# Patient Record
Sex: Female | Born: 1960 | Race: White | Hispanic: No | Marital: Married | Smoking: Former smoker
Health system: Southern US, Community
[De-identification: ages and names within clinical notes are randomized; demographics above are authoritative.]

## PROBLEM LIST (undated history)

## (undated) DIAGNOSIS — E079 Disorder of thyroid, unspecified: Secondary | ICD-10-CM

## (undated) DIAGNOSIS — T783XXA Angioneurotic edema, initial encounter: Secondary | ICD-10-CM

## (undated) DIAGNOSIS — L509 Urticaria, unspecified: Secondary | ICD-10-CM

## (undated) HISTORY — DX: Urticaria, unspecified: L50.9

## (undated) HISTORY — PX: COLONOSCOPY: SHX174

## (undated) HISTORY — DX: Angioneurotic edema, initial encounter: T78.3XXA

## (undated) HISTORY — PX: TONSILLECTOMY: SUR1361

---

## 2018-03-07 ENCOUNTER — Other Ambulatory Visit: Payer: Self-pay

## 2018-03-07 ENCOUNTER — Emergency Department (HOSPITAL_BASED_OUTPATIENT_CLINIC_OR_DEPARTMENT_OTHER)
Admission: EM | Admit: 2018-03-07 | Discharge: 2018-03-07 | Disposition: A | Discharge status: Home / Self Care | Payer: No Typology Code available for payment source | Attending: Emergency Medicine | Admitting: Emergency Medicine

## 2018-03-07 ENCOUNTER — Emergency Department (HOSPITAL_BASED_OUTPATIENT_CLINIC_OR_DEPARTMENT_OTHER): Payer: No Typology Code available for payment source

## 2018-03-07 ENCOUNTER — Encounter (HOSPITAL_BASED_OUTPATIENT_CLINIC_OR_DEPARTMENT_OTHER): Payer: Self-pay | Admitting: *Deleted

## 2018-03-07 DIAGNOSIS — Z79899 Other long term (current) drug therapy: Secondary | ICD-10-CM | POA: Insufficient documentation

## 2018-03-07 DIAGNOSIS — R1032 Left lower quadrant pain: Secondary | ICD-10-CM | POA: Diagnosis present

## 2018-03-07 DIAGNOSIS — K5732 Diverticulitis of large intestine without perforation or abscess without bleeding: Secondary | ICD-10-CM | POA: Insufficient documentation

## 2018-03-07 DIAGNOSIS — Z87891 Personal history of nicotine dependence: Secondary | ICD-10-CM | POA: Diagnosis not present

## 2018-03-07 DIAGNOSIS — K5792 Diverticulitis of intestine, part unspecified, without perforation or abscess without bleeding: Secondary | ICD-10-CM

## 2018-03-07 HISTORY — DX: Disorder of thyroid, unspecified: E07.9

## 2018-03-07 LAB — COMPREHENSIVE METABOLIC PANEL
ALT: 13 U/L (ref 0–44)
AST: 14 U/L — ABNORMAL LOW (ref 15–41)
Albumin: 3.7 g/dL (ref 3.5–5.0)
Alkaline Phosphatase: 74 U/L (ref 38–126)
Anion gap: 6 (ref 5–15)
BUN: 15 mg/dL (ref 6–20)
CO2: 26 mmol/L (ref 22–32)
Calcium: 8.8 mg/dL — ABNORMAL LOW (ref 8.9–10.3)
Chloride: 104 mmol/L (ref 98–111)
Creatinine, Ser: 0.76 mg/dL (ref 0.44–1.00)
GFR calc Af Amer: >60 mL/min (ref >60)
GFR calc non Af Amer: >60 mL/min (ref >60)
Glucose, Bld: 94 mg/dL (ref 70–99)
Potassium: 3.7 mmol/L (ref 3.5–5.1)
Sodium: 136 mmol/L (ref 135–145)
Total Bilirubin: 0.4 mg/dL (ref 0.3–1.2)
Total Protein: 7.1 g/dL (ref 6.5–8.1)

## 2018-03-07 LAB — CBC WITH DIFFERENTIAL/PLATELET
Abs Immature Granulocytes: 0.03 10*3/uL (ref 0.00–0.07)
Basophils Absolute: 0 10*3/uL (ref 0.0–0.1)
Basophils Relative: 0 %
Eosinophils Absolute: 0.1 10*3/uL (ref 0.0–0.5)
Eosinophils Relative: 1 %
HCT: 42.5 % (ref 36.0–46.0)
Hemoglobin: 13.5 g/dL (ref 12.0–15.0)
Immature Granulocytes: 0 %
Lymphocytes Relative: 26 %
Lymphs Abs: 2.6 10*3/uL (ref 0.7–4.0)
MCH: 29.5 pg (ref 26.0–34.0)
MCHC: 31.8 g/dL (ref 30.0–36.0)
MCV: 92.8 fL (ref 80.0–100.0)
Monocytes Absolute: 0.7 10*3/uL (ref 0.1–1.0)
Monocytes Relative: 7 %
Neutro Abs: 6.5 10*3/uL (ref 1.7–7.7)
Neutrophils Relative %: 66 %
Platelets: 380 10*3/uL (ref 150–400)
RBC: 4.58 MIL/uL (ref 3.87–5.11)
RDW: 12.9 % (ref 11.5–15.5)
WBC: 9.9 10*3/uL (ref 4.0–10.5)
nRBC: 0 % (ref 0.0–0.2)

## 2018-03-07 LAB — URINALYSIS, MICROSCOPIC (REFLEX)

## 2018-03-07 LAB — URINALYSIS, ROUTINE W REFLEX MICROSCOPIC
Bilirubin Urine: NEGATIVE
Glucose, UA: NEGATIVE mg/dL
Ketones, ur: NEGATIVE mg/dL
Nitrite: NEGATIVE
Protein, ur: NEGATIVE mg/dL
Specific Gravity, Urine: >1.03 — ABNORMAL HIGH (ref 1.005–1.030)
pH: 5 (ref 5.0–8.0)

## 2018-03-07 LAB — LIPASE, BLOOD: Lipase: 28 U/L (ref 11–51)

## 2018-03-07 MED ORDER — IOPAMIDOL (ISOVUE-300) INJECTION 61%
100.0000 mL | Freq: Once | INTRAVENOUS | Status: AC | PRN
  Administered 2018-03-07: 100 mL via INTRAVENOUS

## 2018-03-07 MED ORDER — SODIUM CHLORIDE 0.9 % IV BOLUS
500.0000 mL | Freq: Once | INTRAVENOUS | Status: AC
  Administered 2018-03-07: 500 mL via INTRAVENOUS

## 2018-03-07 MED ORDER — HYDROCODONE-ACETAMINOPHEN 5-325 MG PO TABS: 1.0000 | ORAL_TABLET | Freq: Four times a day (QID) | ORAL | 0 refills | Status: DC | PRN

## 2018-03-07 NOTE — Discharge Instructions (Signed)
For severe pain, take 1-2 Norco every 6 hours as needed.  You can take Tylenol for mild to moderate pain, however make sure not to take more than 4000 mg of Tylenol in a 24-hour period.  Begin a clear liquid diet as well as a probiotic.  Please follow-up with your doctor in 2 to 3 days for recheck.  Please return to emergency department he develop any new or worsening symptoms including severe pain, intractable vomiting, persistent fever of 100.4, or any other concerning symptoms.  Do not drink alcohol, drive, operate machinery or participate in any other potentially dangerous activities while taking opiate pain medication as it may make you sleepy. Do not take this medication with any other sedating medications, either prescription or over-the-counter. If you were prescribed Percocet or Vicodin, do not take these with acetaminophen (Tylenol) as it is already contained within these medications and overdose of Tylenol is dangerous.   This medication is an opiate (or narcotic) pain medication and can be habit forming.  Use it as little as possible to achieve adequate pain control.  Do not use or use it with extreme caution if you have a history of opiate abuse or dependence. This medication is intended for your use only - do not give any to anyone else and keep it in a secure place where nobody else, especially children, have access to it. It will also cause or worsen constipation, so you may want to consider taking an over-the-counter stool softener while you are taking this medication.

## 2018-03-07 NOTE — ED Triage Notes (Signed)
RLQ pain for 3 days and was on antibiotic, Cirpo 500 mg with no relief.

## 2018-03-07 NOTE — ED Provider Notes (Signed)
[facility] EMERGENCY DEPARTMENT Provider Note   CSN: [id number] Arrival date & time: 03/07/18  1224     History   Chief Complaint Chief Complaint  Patient presents with  . Abdominal Pain    HPI Isabella Potts is a 58 y.o. female with history of diverticulitis who presents with a one-week history of left lower quadrant pain.  Patient reports she was seen by her doctor who started her on Cipro and Flagyl for diverticulitis.  She started that 3 days ago.  She has had worsening and spread of her pain toward her back as well as diarrhea that started in the past few days.  She denies any nausea or vomiting.  She denies any bloody stools.  She denies any fevers, urinary symptoms, abnormal vaginal bleeding or discharge.  She reports it feels similar to the last time she had diverticulitis.  She has not been taking any other medications for her pain.  HPI  Past Medical History:  Diagnosis Date  . Thyroid disease     There are no active problems to display for this patient.   Past Surgical History:  Procedure Laterality Date  . COLONOSCOPY    . TONSILLECTOMY       OB History    Gravida  2   Para  2   Term      Preterm      AB      Living  2     SAB      TAB      Ectopic      Multiple      Live Births               Home Medications    Prior to Admission medications   Medication Sig Start Date End Date Taking? Authorizing Provider  ciprofloxacin (CIPRO) 500 MG tablet Take 500 mg by mouth 2 (two) times daily.   Yes [provider]  metroNIDAZOLE (FLAGYL) 500 MG tablet Take 500 mg by mouth 3 (three) times daily.   Yes [provider]  thyroid (ARMOUR) 60 MG tablet Take 60 mg by mouth daily before breakfast.   Yes [provider]  HYDROcodone-acetaminophen (NORCO/VICODIN) 5-325 MG tablet Take 1-2 tablets by mouth every 6 (six) hours as needed. 03/07/18   Emi HolesLaw, Tavarious Freel M, PA-C    Family History Family History  Problem  Relation Age of Onset  . Cancer Mother   . Diabetes Mother   . Cancer Father     Social History Social History   Tobacco Use  . Smoking status: Former Games developermoker  . Smokeless tobacco: Never Used  Substance Use Topics  . Alcohol use: Not Currently    Comment: occasionally  . Drug use: Never     Allergies   Eggs or egg-derived products; Banana; Beef allergy; Beet [beta vulgaris]; Crab [shellfish allergy]; Milk-related compounds; Sulfamethoxazole-trimethoprim; and Almond (diagnostic)   Review of Systems Review of Systems  Constitutional: Negative for chills and fever.  HENT: Negative for facial swelling and sore throat.   Respiratory: Negative for shortness of breath.   Cardiovascular: Negative for chest pain.  Gastrointestinal: Positive for abdominal pain and diarrhea. Negative for blood in stool, nausea and vomiting.  Genitourinary: Negative for dysuria, vaginal bleeding and vaginal discharge.  Musculoskeletal: Negative for back pain.  Skin: Negative for rash and wound.  Neurological: Negative for headaches.  Psychiatric/Behavioral: The patient is not nervous/anxious.      Physical Exam Updated Vital Signs BP 120/88 (BP Location:  Left Arm)   Pulse 76   Temp 98.6 F (37 C) (Oral)   Resp 16   Ht 5' 3.5" (1.613 m)   Wt 68 kg   LMP  (LMP Unknown)   SpO2 98%   BMI 26.15 kg/m   Physical Exam Vitals signs and nursing note reviewed.  Constitutional:      General: She is not in acute distress.    Appearance: She is well-developed. She is not diaphoretic.  HENT:     Head: Normocephalic and atraumatic.     Mouth/Throat:     Pharynx: No oropharyngeal exudate.  Eyes:     General: No scleral icterus.       Right eye: No discharge.        Left eye: No discharge.     Conjunctiva/sclera: Conjunctivae normal.     Pupils: Pupils are equal, round, and reactive to light.  Neck:     Musculoskeletal: Normal range of motion and neck supple.     Thyroid: No thyromegaly.    Cardiovascular:     Rate and Rhythm: Normal rate and regular rhythm.     Heart sounds: Normal heart sounds. No murmur. No friction rub. No gallop.   Pulmonary:     Effort: Pulmonary effort is normal. No respiratory distress.     Breath sounds: Normal breath sounds. No stridor. No wheezing or rales.  Abdominal:     General: Bowel sounds are normal. There is no distension.     Palpations: Abdomen is soft.     Tenderness: There is abdominal tenderness in the left lower quadrant. There is no right CVA tenderness, left CVA tenderness, guarding or rebound.  Musculoskeletal:     Comments: No tenderness on palpation of the back  Lymphadenopathy:     Cervical: No cervical adenopathy.  Skin:    General: Skin is warm and dry.     Coloration: Skin is not pale.     Findings: No rash.  Neurological:     Mental Status: She is alert.     Coordination: Coordination normal.      ED Treatments / Results  Labs (all labs ordered are listed, but only abnormal results are displayed) Labs Reviewed  COMPREHENSIVE METABOLIC PANEL - Abnormal; Notable for the following components:      Result Value   Calcium 8.8 (*)    AST 14 (*)    All other components within normal limits  URINALYSIS, ROUTINE W REFLEX MICROSCOPIC - Abnormal; Notable for the following components:   Specific Gravity, Urine >1.030 (*)    Hgb urine dipstick MODERATE (*)    Leukocytes, UA TRACE (*)    All other components within normal limits  URINALYSIS, MICROSCOPIC (REFLEX) - Abnormal; Notable for the following components:   Bacteria, UA MANY (*)    All other components within normal limits  URINE CULTURE  CBC WITH DIFFERENTIAL/PLATELET  LIPASE, BLOOD    EKG None  Radiology Ct Abdomen Pelvis W Contrast  Result Date: 03/07/2018 CLINICAL DATA:  Abdominal pain. Left lower quadrant pain. Suspect diverticulitis. EXAM: CT ABDOMEN AND PELVIS WITH CONTRAST TECHNIQUE: Multidetector CT imaging of the abdomen and pelvis was performed  using the standard protocol following bolus administration of intravenous contrast. CONTRAST:  ISOVUE-300 IOPAMIDOL (ISOVUE-300) INJECTION 61% COMPARISON:  None FINDINGS: Lower chest: No acute abnormality. Hepatobiliary: 5 mm low-density structure within right lobe of liver is too small to reliably characterize. The gallbladder appears normal. No biliary ductal dilatation. Pancreas: Unremarkable. No pancreatic ductal dilatation  or surrounding inflammatory changes. Spleen: There are a few scattered low-density foci within the spleen which are nonspecific measuring up to 5 mm. Spleen otherwise unremarkable. Normal in size. Adrenals/Urinary Tract: No mass or hydronephrosis identified. Urinary bladder appears normal. Stomach/Bowel: Normal appearance of the stomach. The small bowel loops are normal in caliber. No abnormal small bowel dilatation, wall thickening or inflammation. Normal appearance of the proximal colon. Distal colonic diverticulosis identified. Asymmetric wall thickening and inflammation involving the proximal sigmoid colon is identified, image 68/2. Findings compatible with acute diverticulitis. No free air. No abscess identified. Vascular/Lymphatic: Aortic atherosclerosis. No aneurysm. No pathologically enlarged abdominal or pelvic lymph nodes. Reproductive: Uterus and bilateral adnexa are unremarkable. Other: No free fluid or fluid collections. Musculoskeletal: No acute or significant osseous findings. IMPRESSION: 1. Imaging findings compatible with acute sigmoid diverticulitis. No evidence for bowel perforation or abscess. 2.  Aortic Atherosclerosis (ICD10-I70.0). Electronically Signed   By: Signa Kell M.D.   On: 03/07/2018 14:49    Procedures Procedures (including critical care time)  Medications Ordered in ED Medications  sodium chloride 0.9 % bolus 500 mL ( Intravenous Stopped 03/07/18 1440)  iopamidol (ISOVUE-300) 61 % injection 100 mL (100 mLs Intravenous Contrast Given 03/07/18  1413)     Initial Impression / Assessment and Plan / ED Course  I have reviewed the triage vital signs and the nursing notes.  Pertinent labs & imaging results that were available during my care of the patient were reviewed by me and considered in my medical decision making (see chart for details).     Patient presenting with diverticulitis.  Labs are unremarkable.  UA shows moderate hematuria, trace leukocytes, many bacteria.  Patient has no urinary symptoms.  We will culture, however considering patient already on Cipro and Flagyl, will not add anything at this point.  Patient is well-appearing.  She declined pain medication throughout her ED course.  Patient advised to continue Cipro and Flagyl.  We will add Norco as needed for pain.  I reviewed the [place] narcotic database and found no discrepancies.  Patient advised to add a probiotic and stick with a clear liquid diet.  Follow-up to PCP in 2 to 3 days for recheck.  Return precautions discussed.  Patient understands and agrees with plan.  Patient vitals stable throughout ED course and discharged in satisfactory condition. I discussed patient case with Dr. Fredderick Phenix who guided the patient's management and agrees with plan.   Final Clinical Impressions(s) / ED Diagnoses   Final diagnoses:  Diverticulitis    ED Discharge Orders         Ordered    HYDROcodone-acetaminophen (NORCO/VICODIN) 5-325 MG tablet  Every 6 hours PRN     03/07/18 1547           Lavone Barrientes, Waylan Boga, PA-C 03/07/18 1635    Rolan Bucco, MD 03/16/18 1502

## 2018-03-09 LAB — URINE CULTURE: Culture: <10000 — AB

## 2020-01-19 IMAGING — CT CT ABD-PELV W/ CM
2 of 5 series · 15 of 46 positions shown, 17 images · IV contrast (APPLIED)
Comparison: None

CLINICAL DATA: Abdominal pain. Left lower quadrant pain. Suspect
diverticulitis.

EXAM:
CT ABDOMEN AND PELVIS WITH CONTRAST
TECHNIQUE: Multidetector CT imaging of the abdomen and pelvis was performed
using the standard protocol following bolus administration of
intravenous contrast.
CONTRAST:  100mL BEA6LD-HCC IOPAMIDOL (BEA6LD-HCC) INJECTION 61%

[Series 2: axial st · axial · 0.76mm/px · z∈[-452,-42]mm · 12 of 94 slices shown, 14 images]
[im 6/94  soft-tissue]
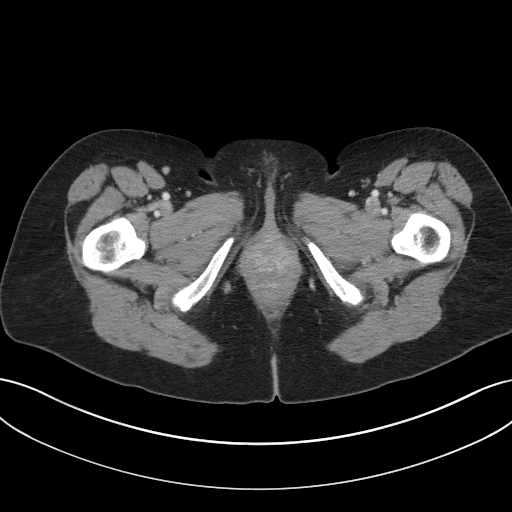
[im 6/94  bone]
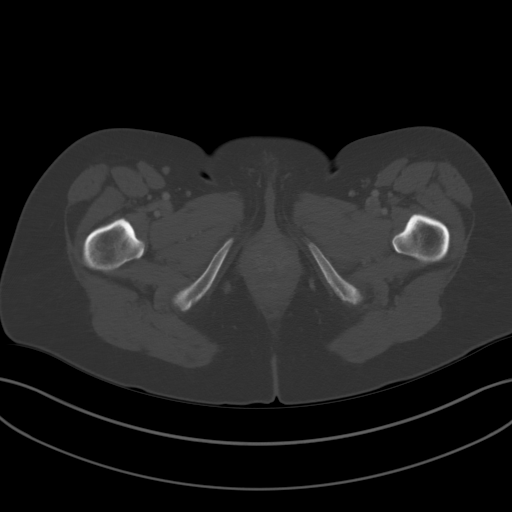
[im 16/94  soft-tissue]
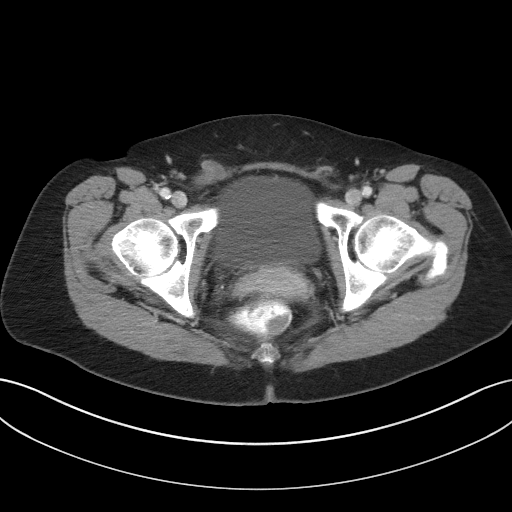
[im 21/94  soft-tissue]
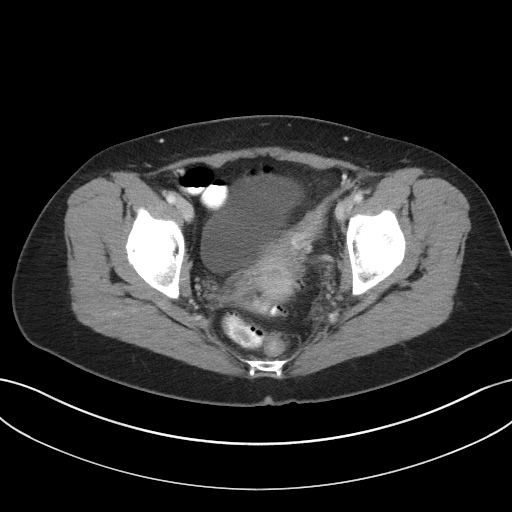
[im 26/94  soft-tissue]
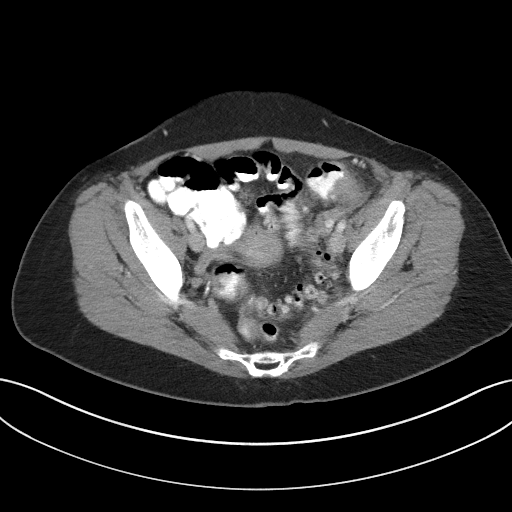
[im 37/94  soft-tissue]
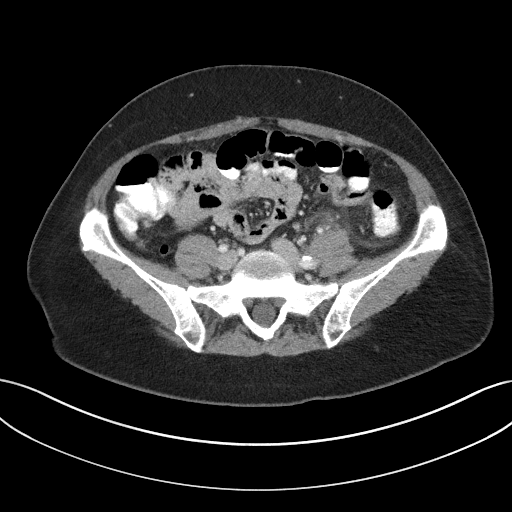
[im 42/94  soft-tissue]
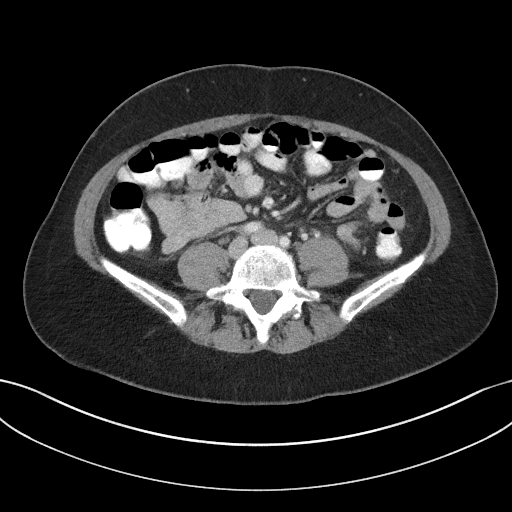
[im 52/94  soft-tissue]
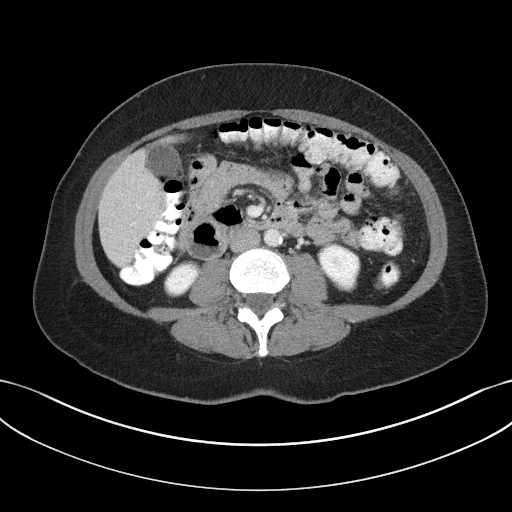
[im 57/94  soft-tissue]
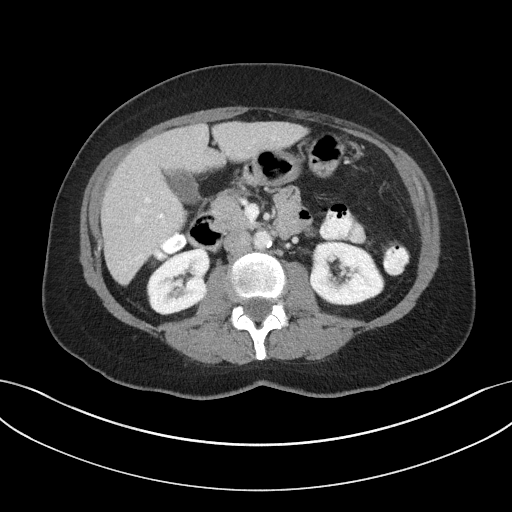
[im 68/94  soft-tissue]
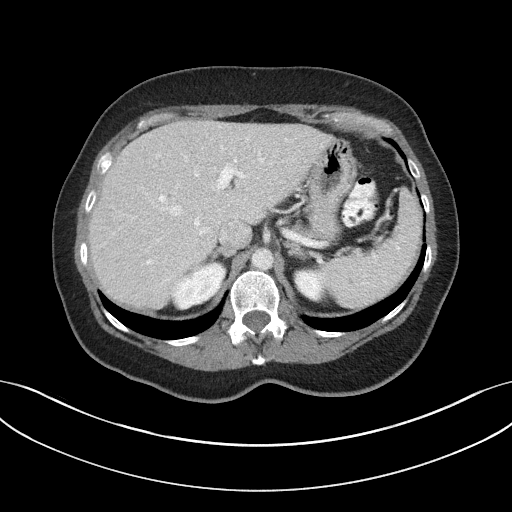
[im 68/94  bone]
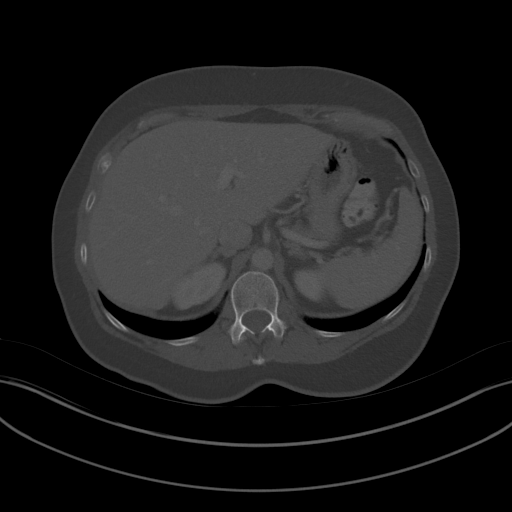
[im 73/94  soft-tissue]
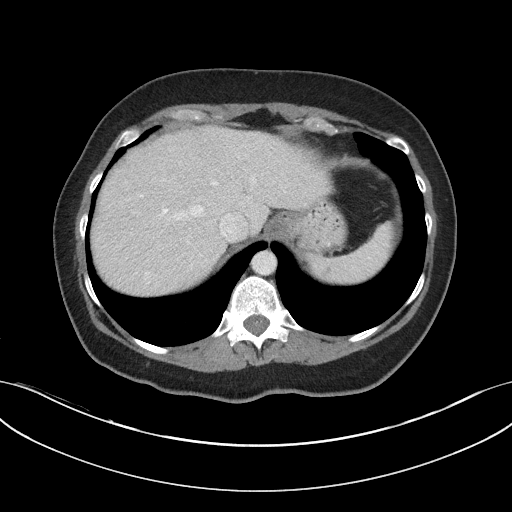
[im 78/94  soft-tissue]
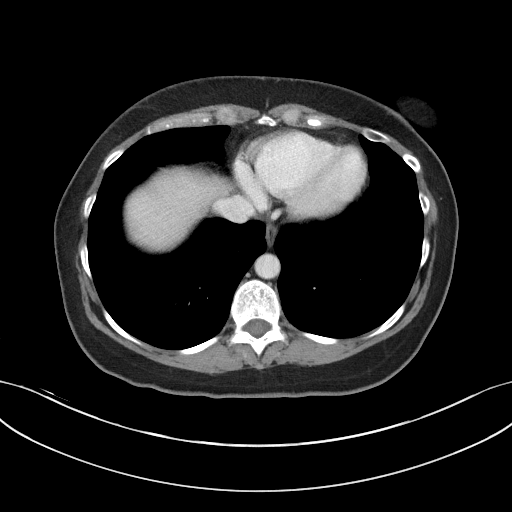
[im 88/94  soft-tissue]
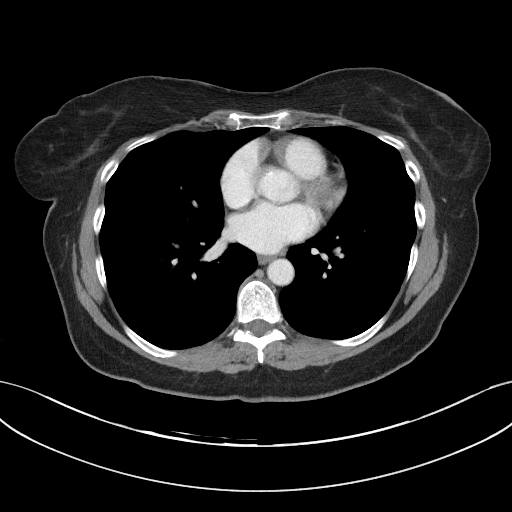

[Series 6: coronal st · coronal · 0.70mm/px · 3 of 107 slices shown]
[im 36/107  soft-tissue]
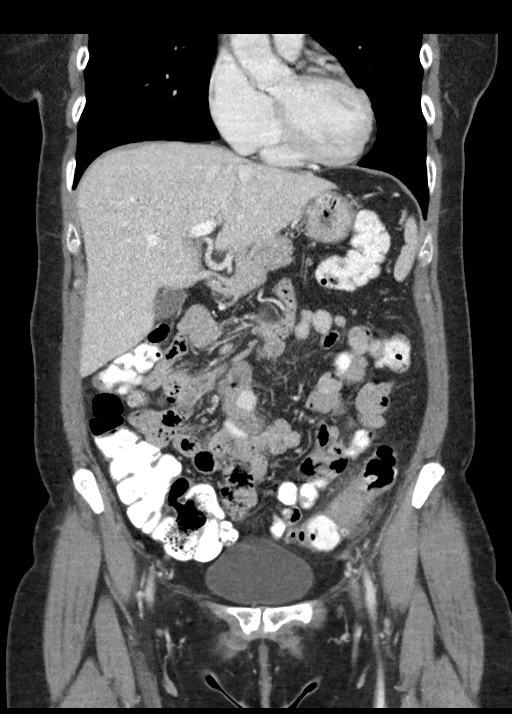
[im 48/107  soft-tissue]
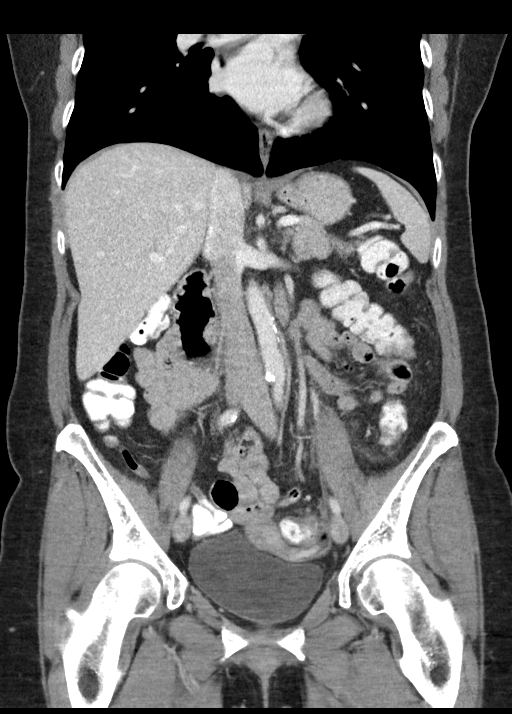
[im 59/107  soft-tissue]
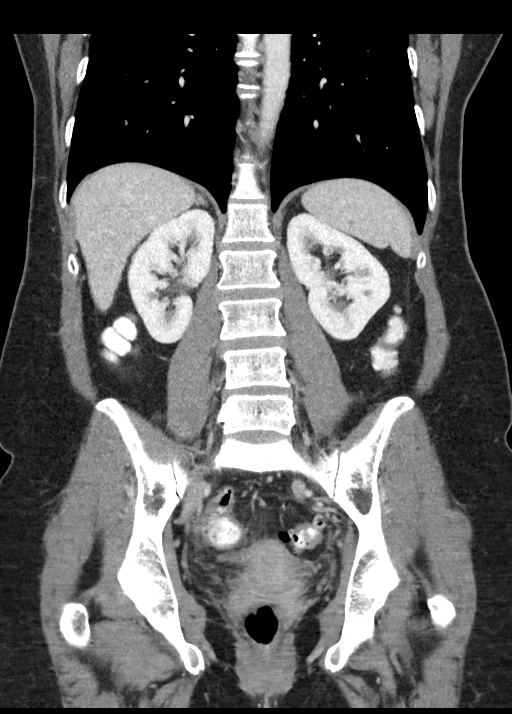

[15 of 46 positions shown; findings below may reference images not displayed]

FINDINGS: Lower chest: No acute abnormality.

Hepatobiliary: 5 mm low-density structure within right lobe of liver
is too small to reliably characterize. The gallbladder appears
normal. No biliary ductal dilatation.

Pancreas: Unremarkable. No pancreatic ductal dilatation or
surrounding inflammatory changes.

Spleen: There are a few scattered low-density foci within the spleen
which are nonspecific measuring up to 5 mm. Spleen otherwise
unremarkable. Normal in size.

Adrenals/Urinary Tract: No mass or hydronephrosis identified.
Urinary bladder appears normal.

Stomach/Bowel: Normal appearance of the stomach. The small bowel
loops are normal in caliber. No abnormal small bowel dilatation,
wall thickening or inflammation. Normal appearance of the proximal
colon. Distal colonic diverticulosis identified. Asymmetric wall
thickening and inflammation involving the proximal sigmoid colon is
identified, image 68/2. Findings compatible with acute
diverticulitis. No free air. No abscess identified.

Vascular/Lymphatic: Aortic atherosclerosis. No aneurysm. No
pathologically enlarged abdominal or pelvic lymph nodes.

Reproductive: Uterus and bilateral adnexa are unremarkable.

Other: No free fluid or fluid collections.

Musculoskeletal: No acute or significant osseous findings.
IMPRESSION: 1. Imaging findings compatible with acute sigmoid diverticulitis. No
evidence for bowel perforation or abscess.
2.  Aortic Atherosclerosis ([id number]).

## 2020-08-01 NOTE — Progress Notes (Signed)
New Patient Note  RE: Isabella Potts MRN: [id number] DOB: 12-25-1960 Date of Office Visit: 08/02/2020  Consult requested by: Harvie Heck, MD Primary care provider: Harvie Heck, MD  Chief Complaint: Rash (Rash on face starting since November of last year and slowly progressed)  History of Present Illness: I had the pleasure of seeing Isabella Potts for initial evaluation [facility]  on 08/02/2020. She is a 60 y.o. female, who is self-referred here for the evaluation of rash, itchy eyes and food sensitivities.  Rash: Rash started in November 2021. This initially started on her nose, eyelids and had some rash on her arms and upper back. Describes them as red, raised, little bumps, slightly pruritic. During this time she had a change in her laundry detergent and shampoos which caused some itching as well.   The rash has not completely resolved. Associated symptoms include: none. Suspected triggers are personal care products - new laundry detergent, new shampoos. Denies any  fevers, chills, changes in medications, foods, or recent infections. She has tried the following therapies: prednisone with some benefit. She did not want to use the topical steroid creams for a long time so she stopped. Systemic steroids yes. Currently on no daily meds.  Previous work up includes: none. Previous history of rash/hives: no. Patient is up to date with the following cancer screening tests: mammogram, colonoscopy. Not up to date with pap smears.  Assessment and Plan: Isabella Potts is a 60 y.o. female with: Rash and other nonspecific skin eruption Slightly pruritic rash on the face which started in November 2021.  Tried oral prednisone and topical steroid cream with some benefit.  Concerned about allergic triggers.  She did have changes in her personal care products at that time.  Today's skin testing showed: Positive to grass, trees and horse.   See below for proper skin care.   Not  sure what's causing the rash - the pollen may be making it worse.   The cyst like bump on the face - if not improving please see dermatology for this. Use Eucrisa (crisaborole) 2% ointment twice a day on the face and body. This is a non-steroid ointment. Samples given.   Recommend patch testing next - for [facility] office (metal and true panel)  If unremarkable then recommend bloodwork/dermatology evaluation next.   Other allergic rhinitis Rhinoconjunctivitis symptoms in the spring.  Took over-the-counter antihistamines with good benefit.  No prior allergy testing.  Today's skin testing showed: Positive to grass, trees and horse.   Start environmental control measures as below.  Use over the counter antihistamines such as Zyrtec (cetirizine), Claritin (loratadine), Allegra (fexofenadine), or Xyzal (levocetirizine) daily as needed. May take twice a day during allergy flares. May switch antihistamines every few months.  Allergic conjunctivitis of both eyes  See assessment and plan as above.  Other adverse food reactions, not elsewhere classified, subsequent encounter Patient was seen by an integrative medicine physician in the past who ordered multiple food IgG blood work and performed skin prick testing which showed multiple positives - see scanned report.  She has been eating some of these foods with no issues.  She is avoiding bananas as it causes blisters in the mouth, beets due to mouth swelling, almonds due to rash and gluten causes fogginess/fatigue.  Limited dairy and soy ingestion due to positive testing in the past.  Continue to avoid foods that give you clinical reactions - bananas, beets, almonds, gluten.  IgG bloodwork to foods are  not useful to identify IgE mediated food allergies and does not correlate to any type of clinical significance.   Patient declines any food allergy evaluation at this time.   For mild symptoms you can take over the counter antihistamines such as  Benadryl and monitor symptoms closely. If symptoms worsen or if you have severe symptoms including breathing issues, throat closure, significant swelling, whole body hives, severe diarrhea and vomiting, lightheadedness then seek immediate medical care.  Elevated blood pressure reading  Follow up with PCP regarding this.  Return for Patch testing.  Meds ordered this encounter  Medications  . Crisaborole (EUCRISA) 2 % OINT    Sig: Apply 1 application topically 2 (two) times daily as needed.    Dispense:  60 g    Refill:  5   Lab Orders  No laboratory test(s) ordered today    Other allergy screening: Asthma: no Rhino conjunctivitis:   Itchy/red eyes, nasal congestion, sneezing in the spring after coming back from [place]. Took over the counter antihistamines with good benefit. No prior allergy testing.   Food allergy:  Currently avoiding beets, almonds, bananas, dairy. Bananas cause blisters on the roof of her mouth Beets cause mouth swelling.  Almonds caused rash on the arms.  Not sure what happens to dairy.  Gluten causes fogginess, fatigue.   Patient had multiple food IgG testing ordered by an integrative medicine physician in the past - see scanned results. She also had skin prick testing by the same integrative medicine physician in 2013 which was +1 positive to vanilla, coffee, orange, lemon, grapefruit, pineapple, strawberry, tomato, buckwheat, wheat, soy, bell pepper, cocoa, onion, fish, cinnamon, mustard, goat milk, almond, asparagus, lettuce, peanuts, pork.  She is eating foods from the list with no issues other than the ones stated above.   Dietary History: patient has been eating other foods including milk, eggs, peanut, limited treenuts, sesame, shellfish, fish, soy, meats, fruits and vegetables. Avoiding peanuts due to diverticulitis.  Avoiding soy due to positive testing.   Medication allergy: yes  Bactrim - not sure what happened. Hymenoptera allergy:  no History of recurrent infections suggestive of immunodeficency: no  Diagnostics: Skin Testing: Environmental allergy panel. Positive to grass, trees and horse.  Results discussed with patient/family.  Airborne Adult Perc - 08/02/20 1059    Time Antigen Placed 1040    Allergen Manufacturer Waynette Buttery    Location Back    Number of Test 59    Panel 1 Select    1. Control-Buffer 50% Glycerol Negative    2. Control-Histamine 1 mg/ml 2+    3. Albumin saline Negative    4. Bahia 3+    5. [place] 2+    6. Johnson Negative    7. Kentucky Blue Negative    8. Meadow Fescue 2+    9. Perennial Rye 2+    10. Sweet Vernal 2+    11. Timothy 2+    12. Cocklebur Negative    13. Burweed Marshelder Negative    14. Ragweed, short Negative    15. Ragweed, Giant Negative    16. Plantain,  English Negative    17. Lamb's Quarters Negative    18. Sheep Sorrell Negative    19. Rough Pigweed Negative    20. Marsh Elder, Rough Negative    21. Mugwort, Common Negative    22. Ash mix Negative    23. Birch mix 2+    24. Beech American Negative    25. Box, Elder Negative  26. Cedar, red Negative    27. Cottonwood, Guinea-Bissau Negative    28. Elm mix --   +/-   29. Hickory 4+    30. Maple mix Negative    31. Oak, Guinea-[place]issau mix 2+    32. Pecan Pollen Negative    33. Pine mix Negative    34. Sycamore Eastern Negative    35. Walnut, Black Pollen 4+    36. Alternaria alternata Negative    37. Cladosporium Herbarum Negative    38. Aspergillus mix Negative    39. Penicillium mix Negative    40. Bipolaris sorokiniana (Helminthosporium) Negative    41. Drechslera spicifera (Curvularia) Negative    42. Mucor plumbeus Negative    43. Fusarium moniliforme Negative    44. Aureobasidium pullulans (pullulara) Negative    45. Rhizopus oryzae Negative    46. Botrytis cinera Negative    47. Epicoccum nigrum Negative    48. Phoma betae Negative    49. Candida Albicans Negative    50. Trichophyton mentagrophytes  Negative    51. Mite, D Farinae  5,000 AU/ml Negative    52. Mite, D Pteronyssinus  5,000 AU/ml Negative    53. Cat Hair 10,000 BAU/ml Negative    54.  Dog Epithelia Negative    55. Mixed Feathers Negative    56. Horse Epithelia 2+    57. Cockroach, German Negative    58. Mouse Negative    59. Tobacco Leaf Negative           Past Medical History: Patient Active Problem List   Diagnosis Date Noted  . Rash and other nonspecific skin eruption 08/02/2020  . Allergic conjunctivitis of both eyes 08/02/2020  . Other allergic rhinitis 08/02/2020  . Other adverse food reactions, not elsewhere classified, subsequent encounter 08/02/2020  . Elevated blood pressure reading 08/02/2020   Past Medical History:  Diagnosis Date  . Angio-edema   . Thyroid disease   . Urticaria    Past Surgical History: Past Surgical History:  Procedure Laterality Date  . COLONOSCOPY    . TONSILLECTOMY     Medication List:  Current Outpatient Medications  Medication Sig Dispense Refill  . Bergamot Oil OIL 50 g by Does not apply route daily.    . Cholecalciferol (VITAMIN D3 PO) Take 1 tablet by mouth daily.    . ciprofloxacin (CIPRO) 500 MG tablet Take 500 mg by mouth 2 (two) times daily.    Lennox Solders (EUCRISA) 2 % OINT Apply 1 application topically 2 (two) times daily as needed. 60 g 5  . famotidine (PEPCID) 20 MG tablet Take 20 mg by mouth 2 (two) times daily.    Marland Kitchen HYDROcodone-acetaminophen (NORCO/VICODIN) 5-325 MG tablet Take 1-2 tablets by mouth every 6 (six) hours as needed. 12 tablet 0  . MAGNESIUM CITRATE PO Take by mouth.    . Menaquinone-7 (VITAMIN K2 PO) Take 1 tablet by mouth daily.    . metroNIDAZOLE (FLAGYL) 500 MG tablet Take 500 mg by mouth 3 (three) times daily.    . Multiple Vitamins-Minerals (CENTRUM SILVER 50+WOMEN PO) Take 1 tablet by mouth daily.    . Pantothenic Acid 500 MG TABS Take by mouth.    . polyethylene glycol (MIRALAX / GLYCOLAX) 17 g packet Take by mouth daily. 1  capful daily    . PREGNENOLONE MICRONIZED PO Take 75 mg by mouth once a week.    Marland Kitchen PROBIOTIC PRODUCT PO Take by mouth.    . progesterone (PROMETRIUM) 100 MG capsule  Take by mouth.    . thyroid (ARMOUR) 60 MG tablet Take 60 mg by mouth daily before breakfast.    . TURMERIC PO Take by mouth.    . Wheat Dextrin (BENEFIBER) POWD Take by mouth 3 (three) times daily. 2 tsp 3 times daily     No current facility-administered medications for this visit.   Allergies: Allergies  Allergen Reactions  . Eggs Or Egg-Derived Products Other (See Comments)  . Banana Hives  . Beef Allergy     Has difficulty passing beef products  . Beet [Beta Vulgaris] Swelling  . Crab [Shellfish Allergy] Other (See Comments)    Headache  . Milk-Related Compounds     Rash  . Sulfamethoxazole-Trimethoprim   . Almond (Diagnostic) Rash   Social History: Social History   Socioeconomic History  . Marital status: Married    Spouse name: Not on file  . Number of children: Not on file  . Years of education: Not on file  . Highest education level: Not on file  Occupational History  . Not on file  Tobacco Use  . Smoking status: Former Games developermoker  . Smokeless tobacco: Never Used  Vaping Use  . Vaping Use: Never used  Substance and Sexual Activity  . Alcohol use: Not Currently    Comment: occasionally  . Drug use: Never  . Sexual activity: Yes  Other Topics Concern  . Not on file  Social History Narrative  . Not on file   Social Determinants of Health   Financial Resource Strain: Not on file  Food Insecurity: Not on file  Transportation Needs: Not on file  Physical Activity: Not on file  Stress: Not on file  Social Connections: Not on file   Lives in a house built in [place]. Smoking: denies Occupation: Wellsite geologistdepartment administrator  Environmental History: Water Damage/mildew in the house: no Carpet in the family room: yes Carpet in the bedroom: yes Heating: heat pump Cooling: central Pet: yes 1 dog x 7  yrs  Family History: Family History  Problem Relation Age of Onset  . Cancer Mother   . Diabetes Mother   . Cancer Father   . Allergic rhinitis Father   . Asthma Neg Hx   . Eczema Neg Hx   . Urticaria Neg Hx    Review of Systems  Constitutional: Negative for appetite change, chills, fever and unexpected weight change.  HENT: Negative for congestion and rhinorrhea.   Eyes: Positive for itching.  Respiratory: Negative for cough, chest tightness, shortness of breath and wheezing.   Cardiovascular: Negative for chest pain.  Gastrointestinal: Negative for abdominal pain.  Genitourinary: Negative for difficulty urinating.  Skin: Positive for rash.  Allergic/Immunologic: Positive for environmental allergies.  Neurological: Negative for headaches.   Objective: BP 128/84 (BP Location: Right Arm)   Pulse 94   Temp (!) 97.4 F (36.3 C) (Temporal)   Resp 18   Ht 5' 3.5" (1.613 m)   Wt 176 lb (79.8 kg)   LMP  (LMP Unknown)   SpO2 97%   BMI 30.69 kg/m  Body mass index is 30.69 kg/m. Physical Exam Vitals and nursing note reviewed.  Constitutional:      Appearance: Normal appearance. She is well-developed.  HENT:     Head: Normocephalic and atraumatic.     Right Ear: Tympanic membrane and external ear normal.     Left Ear: Tympanic membrane and external ear normal.     Nose: Nose normal.     Mouth/Throat:  Mouth: Mucous membranes are moist.     Pharynx: Oropharynx is clear.  Eyes:     Conjunctiva/sclera: Conjunctivae normal.  Cardiovascular:     Rate and Rhythm: Normal rate and regular rhythm.     Heart sounds: Normal heart sounds. No murmur heard. No friction rub. No gallop.   Pulmonary:     Effort: Pulmonary effort is normal.     Breath sounds: Normal breath sounds. No wheezing, rhonchi or rales.  Musculoskeletal:     Cervical back: Neck supple.  Skin:    General: Skin is warm.     Findings: Rash present.     Comments: Erythematous patches on nasal folds and  periorbitally.   Neurological:     Mental Status: She is alert and oriented to person, place, and time.  Psychiatric:        Behavior: Behavior normal.    The plan was reviewed with the patient/family, and all questions/concerned were addressed.  It was my pleasure to see Laretha today and participate in her care. Please feel free to contact me with any questions or concerns.  Sincerely,  Wyline Mood, DO Allergy & Immunology  [facility] office: [phone] [facility] office: [phone]

## 2020-08-02 ENCOUNTER — Ambulatory Visit (INDEPENDENT_AMBULATORY_CARE_PROVIDER_SITE_OTHER): Payer: No Typology Code available for payment source | Admitting: Allergy

## 2020-08-02 ENCOUNTER — Other Ambulatory Visit: Payer: Self-pay

## 2020-08-02 ENCOUNTER — Encounter: Payer: Self-pay | Admitting: Allergy

## 2020-08-02 VITALS — BP 128/84 | HR 94 | Temp 97.4°F | Resp 18 | Ht 63.5 in | Wt 176.0 lb

## 2020-08-02 DIAGNOSIS — J3089 Other allergic rhinitis: Secondary | ICD-10-CM | POA: Diagnosis not present

## 2020-08-02 DIAGNOSIS — H1013 Acute atopic conjunctivitis, bilateral: Secondary | ICD-10-CM | POA: Diagnosis not present

## 2020-08-02 DIAGNOSIS — T781XXD Other adverse food reactions, not elsewhere classified, subsequent encounter: Secondary | ICD-10-CM

## 2020-08-02 DIAGNOSIS — R21 Rash and other nonspecific skin eruption: Secondary | ICD-10-CM | POA: Diagnosis not present

## 2020-08-02 DIAGNOSIS — R030 Elevated blood-pressure reading, without diagnosis of hypertension: Secondary | ICD-10-CM

## 2020-08-02 MED ORDER — EUCRISA 2 % EX OINT: 1.0000 "application " | TOPICAL_OINTMENT | Freq: Two times a day (BID) | CUTANEOUS | 5 refills | Status: DC | PRN

## 2020-08-02 NOTE — Patient Instructions (Addendum)
Today's skin testing showed: Positive to grass, trees and horse.   Rash:  See below for proper skin care.   Not sure what's causing the rash - the pollen may be making it worse.   The other bump on the face - if not improving please see dermatology for this.  Use Eucrisa (crisaborole) 2% ointment twice a day on the face and body. This is a non-steroid ointment. Samples given.  If it burns, place the medication in the refrigerator.  Apply a thin layer of moisturizer and then apply the Eucrisa on top of it.  Recommend patch testing next - for [facility] office.   Patches are best placed on Monday with return to office on Wednesday and Friday of same week for readings.  Patches once placed should not get wet.  You do not have to stop any medications for patch testing but should not be on oral prednisone. You can schedule a patch testing visit when convenient for your schedule.    Environmental allergies  Start environmental control measures as below.  Use over the counter antihistamines such as Zyrtec (cetirizine), Claritin (loratadine), Allegra (fexofenadine), or Xyzal (levocetirizine) daily as needed. May take twice a day during allergy flares. May switch antihistamines every few months.  Food  Continue to avoid foods that give you clinical reactions - bananas, beets, almonds, gluten.  IgG bloodwork to foods are not useful to identify food allergies.  For mild symptoms you can take over the counter antihistamines such as Benadryl and monitor symptoms closely. If symptoms worsen or if you have severe symptoms including breathing issues, throat closure, significant swelling, whole body hives, severe diarrhea and vomiting, lightheadedness then seek immediate medical care.  Elevated blood pressure:  Follow up with PCP regarding this.  Follow up for patch testing. Follow up in 2 months for regular office visit  Skin care recommendations  Bath time: . Always use lukewarm water.  AVOID very hot or cold water. Marland Kitchen Keep bathing time to 5-10 minutes. . Do NOT use bubble bath. . Use a mild soap and use just enough to wash the dirty areas. . Do NOT scrub skin vigorously.  . After bathing, pat dry your skin with a towel. Do NOT rub or scrub the skin.  Moisturizers and prescriptions:  . ALWAYS apply moisturizers immediately after bathing (within 3 minutes). This helps to lock-in moisture. . Use the moisturizer several times a day over the whole body. Peri Jefferson summer moisturizers include: Aveeno, CeraVe, Cetaphil. Peri Jefferson winter moisturizers include: Aquaphor, Vaseline, Cerave, Cetaphil, Eucerin, Vanicream. . When using moisturizers along with medications, the moisturizer should be applied about one hour after applying the medication to prevent diluting effect of the medication or moisturize around where you applied the medications. When not using medications, the moisturizer can be continued twice daily as maintenance.  Laundry and clothing: . Avoid laundry products with added color or perfumes. . Use unscented hypo-allergenic laundry products such as Tide free, Cheer free & gentle, and All free and clear.  . If the skin still seems dry or sensitive, you can try double-rinsing the clothes. . Avoid tight or scratchy clothing such as wool. . Do not use fabric softeners or dyer sheets.  Reducing Pollen Exposure . Pollen seasons: trees (spring), grass (summer) and ragweed/weeds (fall). Marland Kitchen Keep windows closed in your home and car to lower pollen exposure.  Lilian Kapur air conditioning in the bedroom and throughout the house if possible.  . Avoid going out in dry  windy days - especially early morning. . Pollen counts are highest between 5 - 10 AM and on dry, hot and windy days.  . Save outside activities for late afternoon or after a heavy rain, when pollen levels are lower.  . Avoid mowing of grass if you have grass pollen allergy. Marland Kitchen Be aware that pollen can also be transported  indoors on people and pets.  . Dry your clothes in an automatic dryer rather than hanging them outside where they might collect pollen.  . Rinse hair and eyes before bedtime.  Pet Allergen Avoidance: . Contrary to popular opinion, there are no "hypoallergenic" breeds of dogs or cats. That is because people are not allergic to an animal's hair, but to an allergen found in the animal's saliva, dander (dead skin flakes) or urine. Pet allergy symptoms typically occur within minutes. For some people, symptoms can build up and become most severe 8 to 12 hours after contact with the animal. People with severe allergies can experience reactions in public places if dander has been transported on the pet owners' clothing. Marland Kitchen Keeping an animal outdoors is only a partial solution, since homes with pets in the yard still have higher concentrations of animal allergens. . Before getting a pet, ask your allergist to determine if you are allergic to animals. If your pet is already considered part of your family, try to minimize contact and keep the pet out of the bedroom and other rooms where you spend a great deal of time. . As with dust mites, vacuum carpets often or replace carpet with a hardwood floor, tile or linoleum. . High-efficiency particulate air (HEPA) cleaners can reduce allergen levels over time. . While dander and saliva are the source of cat and dog allergens, urine is the source of allergens from rabbits, hamsters, mice and [place] pigs; so ask a non-allergic family member to clean the animal's cage. . If you have a pet allergy, talk to your allergist about the potential for allergy immunotherapy (allergy shots). This strategy can often provide long-term relief.

## 2020-08-02 NOTE — Assessment & Plan Note (Addendum)
Slightly pruritic rash on the face which started in November 2021.  Tried oral prednisone and topical steroid cream with some benefit.  Concerned about allergic triggers.  She did have changes in her personal care products at that time.  Today's skin testing showed: Positive to grass, trees and horse.   See below for proper skin care.   Not sure what's causing the rash - the pollen may be making it worse.   The cyst like bump on the face - if not improving please see dermatology for this. Use Eucrisa (crisaborole) 2% ointment twice a day on the face and body. This is a non-steroid ointment. Samples given.   Recommend patch testing next - for [facility] office (metal and true panel)  If unremarkable then recommend bloodwork/dermatology evaluation next.

## 2020-08-02 NOTE — Assessment & Plan Note (Signed)
Patient was seen by an integrative medicine physician in the past who ordered multiple food IgG blood work and performed skin prick testing which showed multiple positives - see scanned report.  She has been eating some of these foods with no issues.  She is avoiding bananas as it causes blisters in the mouth, beets due to mouth swelling, almonds due to rash and gluten causes fogginess/fatigue.  Limited dairy and soy ingestion due to positive testing in the past.  Continue to avoid foods that give you clinical reactions - bananas, beets, almonds, gluten.  IgG bloodwork to foods are not useful to identify IgE mediated food allergies and does not correlate to any type of clinical significance.   Patient declines any food allergy evaluation at this time.   For mild symptoms you can take over the counter antihistamines such as Benadryl and monitor symptoms closely. If symptoms worsen or if you have severe symptoms including breathing issues, throat closure, significant swelling, whole body hives, severe diarrhea and vomiting, lightheadedness then seek immediate medical care.

## 2020-08-02 NOTE — Assessment & Plan Note (Signed)
Rhinoconjunctivitis symptoms in the spring.  Took over-the-counter antihistamines with good benefit.  No prior allergy testing.  Today's skin testing showed: Positive to grass, trees and horse.   Start environmental control measures as below.  Use over the counter antihistamines such as Zyrtec (cetirizine), Claritin (loratadine), Allegra (fexofenadine), or Xyzal (levocetirizine) daily as needed. May take twice a day during allergy flares. May switch antihistamines every few months.

## 2020-08-02 NOTE — Assessment & Plan Note (Signed)
.   Follow up with PCP regarding this.  

## 2020-08-02 NOTE — Assessment & Plan Note (Signed)
.   See assessment and plan as above. 

## 2020-08-07 ENCOUNTER — Telehealth: Payer: Self-pay

## 2020-08-07 NOTE — Telephone Encounter (Signed)
Santiago Bur with [employer] called to check on the status of this prior authorization. I told him someone from [employer] had called this morning. I told him the nurse asked for it to be resubmitted and she would handle it today. He was unsure of who was to resubmit it. He gave his phone number to clarify this. [phone]

## 2020-08-07 NOTE — Telephone Encounter (Signed)
Pharmacy called wanting to know the status of a prior authorization for Eucrisa. I asked if they could resubmit the request and I will handle it today.

## 2020-08-07 NOTE — Telephone Encounter (Signed)
Spoke to patient about the prior authorization for the [place] and patient states she doesn't want Eucrisa because the sample she received was stinging even after refrigerated and the following morning her eyes were puffy and tender to touch. She is using over the counter Cortisone 10 and it's working just fine.   Calling pharmacy to inform them to stop the Rx for Eucrisa because she doesn't want to use it.

## 2020-08-07 NOTE — Telephone Encounter (Signed)
Isabella Potts with [employer] also asked me to call this patient to find out her preferred pharmacy. He gave me a phone number, [phone], she needs to call to let [employer] know this so they can ship her Eucrisa there. I have called the patient and had to leave a message to call me back.

## 2020-09-10 ENCOUNTER — Encounter: Payer: Self-pay | Admitting: Family Medicine

## 2020-09-10 ENCOUNTER — Other Ambulatory Visit: Payer: Self-pay

## 2020-09-10 ENCOUNTER — Ambulatory Visit: Payer: No Typology Code available for payment source | Admitting: Family Medicine

## 2020-09-10 VITALS — BP 136/94 | HR 87 | Resp 16

## 2020-09-10 DIAGNOSIS — L230 Allergic contact dermatitis due to metals: Secondary | ICD-10-CM

## 2020-09-10 DIAGNOSIS — L253 Unspecified contact dermatitis due to other chemical products: Secondary | ICD-10-CM

## 2020-09-10 NOTE — Progress Notes (Signed)
Follow-up Note  RE: Isabella Potts MRN: [id number] DOB: [birth year] Date of Office Visit: 09/10/2020  Primary care provider: Harvie Heck, MD Referring provider: Harvie Heck, MD   Isabella Potts returns to the office today for the patch test placement, given suspected history of contact dermatitis.    Diagnostics: Metal patches and True Test patches placed.   Plan:   Allergic contact dermatitis - Instructions provided on care of the patches for the next 48 hours. Isabella Potts was instructed to avoid showering for the next 48 hours. Isabella Potts will follow up in 48 hours and 96 hours for patch readings.    Call the clinic if this treatment plan is not working well for you  Follow up in 2 days or sooner if needed.  Thank you for the opportunity to care for this patient.  Please do not hesitate to contact me with questions.  Thermon Leyland, FNP [facility]

## 2020-09-10 NOTE — Patient Instructions (Signed)
Diagnostics: Metal patches and True Test patches placed.   Plan:   Allergic contact dermatitis - Instructions provided on care of the patches for the next 48 hours. Isabella Potts was instructed to avoid showering for the next 48 hours. [name] will follow up in 48 hours and 96 hours for patch readings.    Call the clinic if this treatment plan is not working well for you  Follow up in 2 days or sooner if needed.

## 2020-09-12 ENCOUNTER — Other Ambulatory Visit: Payer: Self-pay

## 2020-09-12 ENCOUNTER — Ambulatory Visit: Payer: No Typology Code available for payment source | Admitting: Allergy

## 2020-09-12 ENCOUNTER — Encounter: Payer: Self-pay | Admitting: Allergy

## 2020-09-12 DIAGNOSIS — L239 Allergic contact dermatitis, unspecified cause: Secondary | ICD-10-CM

## 2020-09-12 NOTE — Assessment & Plan Note (Signed)
TRUE and metal patches removed.  Positive to gold and borderline positive to carba mix. Handouts given.

## 2020-09-12 NOTE — Progress Notes (Signed)
Follow Up Note  RE: Isabella Potts MRN: [id number] DOB: June 19, 1960 Date of Office Visit: 09/12/2020  Referring provider: Harvie Heck, MD Primary care provider: Harvie Heck, MD  History of Present Illness: I had the pleasure of seeing Isabella Potts for a follow up visit at the [facility] on 09/12/2020. She is a 60 y.o. female, who is being followed for dermatitis. Today she is here for initial patch test interpretation, given suspected history of contact dermatitis.   Skin is doing better after using Vanicream products.   Diagnostics:  TRUE and metal TEST 48 hour reading:   T.R.U.E. Test - 09/12/20 1100     Time Antigen Placed 1435    Manufacturer Greer    Location Back    Number of Test 36    Reading Interval Day 1;Day 3;Day 5    Panel Panel 1;Panel 2;Panel 3    1. Nickel Sulfate 0    2. Wool Alcohols 0    3. Neomycin Sulfate 0    4. Potassium Dichromate 0    5. Caine Mix 0    6. Fragrance Mix 0    7. Colophony 0    8. Paraben Mix 0    9. Negative Control 0    10. Balsam of [place] 0    11. Ethylenediamine Dihydrochloride 0    12. Cobalt Dichloride 0    13. p-tert Butylphenol Formaldehyde Resin 0    14. Epoxy Resin 0    15. Carba Mix --   +/-   16.  Black Rubber Mix 0    17. Cl+ Me-Isothiazolinone 0    18. Quaternium-15 0    19. Methyldibromo Glutaronitrile 0    20. p-Phenylenediamine 0    21. Formaldehyde 0    22. Mercapto Mix 0    23. Thimerosal 0    24. Thiuram Mix 0    25. Diazolidinyl Urea 0    26. Quinoline Mix 0    27. Tixocortol-21-Pivalate 0    28. Gold Sodium Thiosulfate 1    29. Imidazolidinyl Urea 0    30. Budesonide 0    31. Hydrocortisone-17-Butyrate 0    32. Mercaptobenzothiazole 0    33. Bacitracin 0    34. Parthenolide 0    35. Disperse Blue 106 0    36. 2-Bromo-2-Nitropropane-1,3-diol 0             Metals Patch - 09/12/20 1100     Time Antigen Placed 1435    Manufacturer Greer    Location Back    Number  of Test 11    Reading Interval Day 1;Day 3;Day 5    Aluminum Hydroxide 10% 0    Chromium chloride 1% 0    Cobalt chloride hexahydrate 1% 0    Molybdenum chloride 0.5% 0    Nickel sulfate hexahydrate 5% 0    Potassium dichromate 0.25% 0    Copper sulfate pentahydrate 2% 0    Tantal 1% 0    Titanium 0.1% 0    Manganese chloride 0.5% 0    Vanadium Pentoxide 10% 0    Other 0   nickel (paper clip)   Other Omitted              Assessment and Plan: Isabella Potts is a 60 y.o. female with: Allergic contact dermatitis TRUE and metal patches removed. Positive to gold and borderline positive to carba mix. Handouts given.  Return in about 2 days (around 09/14/2020) for Patch reading.  It was my pleasure to see Isabella Potts today and participate in her care. Please feel free to contact me with any questions or concerns.  Sincerely,  Isabella Mood, DO Allergy & Immunology  [facility] office: [phone] [facility] office: [phone] [place] office: [phone]

## 2020-09-14 ENCOUNTER — Ambulatory Visit: Payer: No Typology Code available for payment source | Admitting: Family

## 2020-09-14 ENCOUNTER — Other Ambulatory Visit: Payer: Self-pay

## 2020-09-14 ENCOUNTER — Encounter: Payer: Self-pay | Admitting: Family

## 2020-09-14 VITALS — BP 120/80 | HR 86 | Temp 98.1°F | Resp 18

## 2020-09-14 DIAGNOSIS — L239 Allergic contact dermatitis, unspecified cause: Secondary | ICD-10-CM

## 2020-09-14 NOTE — Progress Notes (Signed)
Rhema returns to the office today for the final patch test interpretation, given suspected history of contact dermatitis.  She reports that yesterday she saw Farrel Gobble at [facility] dermatology and is currently taking doxycycline, metronidazole, and another medication with ivermectin in it.  She has not started the medication with the ivermectin in it.  She was told by dermatology that citrus, chocolate, and cinnamon are common foods to cause skin problems.  She is interested in discussing possible skin testing to foods with Dr. Selena Batten at her next office visit.   Diagnostics:   TRUE TEST 96-hour hour reading: all negative Metal patch test plus nickel (paper clip) negative   Plan:   Allergic contact dermatitis - The patient has been provided detailed information regarding the substances she is sensitive to, as well as products containing the substances.   - Meticulous avoidance of these substances is recommended.  - If avoidance is not possible, the use of barrier creams or lotions is recommended. - Continue to follow up with [facility] and their plan -Keep already scheduled follow up appointment with Dr. Selena Batten on 10/04/2020 @8 :30 am  Please let know if this treatment plan is not working well for you.  [place], FNP [facility] [place]

## 2020-10-04 ENCOUNTER — Other Ambulatory Visit: Payer: Self-pay

## 2020-10-04 ENCOUNTER — Encounter: Payer: Self-pay | Admitting: Allergy

## 2020-10-04 ENCOUNTER — Ambulatory Visit: Payer: No Typology Code available for payment source | Admitting: Allergy

## 2020-10-04 ENCOUNTER — Telehealth: Payer: Self-pay | Admitting: Allergy

## 2020-10-04 VITALS — BP 130/86 | HR 83 | Temp 97.1°F | Resp 16

## 2020-10-04 DIAGNOSIS — J3089 Other allergic rhinitis: Secondary | ICD-10-CM

## 2020-10-04 DIAGNOSIS — R21 Rash and other nonspecific skin eruption: Secondary | ICD-10-CM | POA: Diagnosis not present

## 2020-10-04 DIAGNOSIS — L239 Allergic contact dermatitis, unspecified cause: Secondary | ICD-10-CM

## 2020-10-04 DIAGNOSIS — J302 Other seasonal allergic rhinitis: Secondary | ICD-10-CM | POA: Diagnosis not present

## 2020-10-04 DIAGNOSIS — T781XXD Other adverse food reactions, not elsewhere classified, subsequent encounter: Secondary | ICD-10-CM

## 2020-10-04 DIAGNOSIS — H1013 Acute atopic conjunctivitis, bilateral: Secondary | ICD-10-CM

## 2020-10-04 DIAGNOSIS — H1010 Acute atopic conjunctivitis, unspecified eye: Secondary | ICD-10-CM | POA: Insufficient documentation

## 2020-10-04 NOTE — Progress Notes (Signed)
Follow Up Note  RE: Isabella Potts MRN: [id number] DOB: 09/24/1960 Date of Office Visit: 10/04/2020  Referring provider: Harvie Heck, MD Primary care provider: Harvie Heck, MD  Chief Complaint: Allergies (Still the same sneezing, still has a rash on her face, still on antibiotics, wants to get tested for foods but took an antihistamine this morning.)  History of Present Illness: I had the pleasure of seeing Isabella Potts for a follow up visit at the [facility] on 10/04/2020. She is a 60 y.o. female, who is being followed for rash. Her previous allergy office visit was on 09/14/2020 with Nehemiah Settle FNP. Today is a regular follow up visit.  Rash Rash is unchanged. She is using some creams and doxycycline prescribed by dermatology.  She has follow-up appointment with them later this month. Currently using vanicream products which was on the safe list.   Eucrisa caused more puffiness and tenderness.   Allergic rhino conjunctivitis Currently taking [employer] brand loratadine which helps with the sneezing.   Assessment and Plan: Isabella Potts is a 60 y.o. female with: Rash and other nonspecific skin eruption Past history - Slightly pruritic rash on the face which started in November 2021.  Tried oral prednisone and topical steroid cream with some benefit. She did have changes in her personal care products at that time. Interim history - TRUE and metal patch testing showed positive to gold and borderline to carba mix. Patient is using vanicream products and topical cream given by dermatology. Continue proper skin care and only use items on the safe list.  Not sure what's causing the rash. Follow up with dermatology - continue with the creams and antibiotics as prescribed.  Get bloodwork to rule out other etiologies.  Allergic contact dermatitis TRUE and metal patches: Positive to gold and borderline positive to carba mix.  Continue avoidance.  Seasonal and perennial  allergic rhinoconjunctivitis Past history - Rhinoconjunctivitis symptoms in the spring.  2022 skin testing showed: Positive to grass, trees and horse.  Interim history - loratadine helps with the sneezing. Continue environmental control measures as below. Use over the counter antihistamines such as Zyrtec (cetirizine), Claritin (loratadine), Allegra (fexofenadine), or Xyzal (levocetirizine) daily as needed. May take twice a day during allergy flares. May switch antihistamines every few months.  Other adverse food reactions, not elsewhere classified, subsequent encounter Past history - Patient was seen by an integrative medicine physician in the past who ordered multiple food IgG blood work and performed skin prick testing which showed multiple positives - see scanned report.  She has been eating some of these foods with no issues.  She is avoiding bananas as it causes blisters in the mouth, beets due to mouth swelling, almonds due to rash and gluten causes fogginess/fatigue.  Limited dairy and soy ingestion due to positive testing in the past. Continue to avoid foods that give you clinical reactions - bananas, beets, almonds, gluten.  Return if symptoms worsen or fail to improve.  No orders of the defined types were placed in this encounter.  Lab Orders  Alpha-Gal Panel  ANA w/Reflex  C-reactive protein  Sedimentation rate  Thyroid Cascade Profile  Tryptase    Diagnostics: None.  Medication List:  Current Outpatient Medications  Medication Sig Dispense Refill   ARMOUR THYROID 90 MG tablet Take 90 mg by mouth daily.     atorvastatin (LIPITOR) 20 MG tablet Take by mouth.     Bergamot Oil OIL 50 g by Does not apply route daily.  Cholecalciferol (VITAMIN D3 PO) Take 1 tablet by mouth daily.     doxycycline (MONODOX) 100 MG capsule Take 1 capsule by mouth daily as needed.     estradiol (VIVELLE-DOT) 0.05 MG/24HR patch Place 1 patch onto the skin 2 (two) times a week.     famotidine  (PEPCID) 20 MG tablet Take 20 mg by mouth 2 (two) times daily.     MAGNESIUM CITRATE PO Take by mouth.     Menaquinone-7 (VITAMIN K2 PO) Take 1 tablet by mouth daily.     Multiple Vitamins-Minerals (CENTRUM SILVER 50+WOMEN PO) Take 1 tablet by mouth daily.     Pantothenic Acid 500 MG TABS Take by mouth.     polyethylene glycol (MIRALAX / GLYCOLAX) 17 g packet Take by mouth daily. 1 capful daily     PREGNENOLONE MICRONIZED PO Take 75 mg by mouth once a week.     PROBIOTIC PRODUCT PO Take by mouth.     progesterone (PROMETRIUM) 100 MG capsule Take by mouth.     triamcinolone cream (KENALOG) 0.1 % Apply topically 2 (two) times daily.     TURMERIC PO Take by mouth.     Wheat Dextrin (BENEFIBER) POWD Take by mouth 3 (three) times daily. 2 tsp 3 times daily     No current facility-administered medications for this visit.   Allergies: Allergies  Allergen Reactions   Eggs Or Egg-Derived Products Other (See Comments)   Almond (Diagnostic) Rash   Gluten Meal Other (See Comments)    Abdominal bloating, diarrhea, sleepy, "trouble thinking"   Haemophilus Influenzae Vaccines Other (See Comments) and Rash    Headache   Lac Bovis Other (See Comments)    unknown   Banana Hives   Beef Allergy     Has difficulty passing beef products   Beet [Beta Vulgaris] Swelling   Crab [Shellfish Allergy] Other (See Comments)    Headache   Milk-Related Compounds     Rash   Sulfamethoxazole-Trimethoprim    I reviewed her past medical history, social history, family history, and environmental history and no significant changes have been reported from her previous visit.  Review of Systems  Constitutional:  Negative for appetite change, chills, fever and unexpected weight change.  HENT:  Negative for congestion and rhinorrhea.   Eyes:  Negative for itching.  Respiratory:  Negative for cough, chest tightness, shortness of breath and wheezing.   Cardiovascular:  Negative for chest pain.  Gastrointestinal:   Negative for abdominal pain.  Genitourinary:  Negative for difficulty urinating.  Skin:  Positive for rash.  Allergic/Immunologic: Positive for environmental allergies.  Neurological:  Negative for headaches.   Objective: BP 130/86 (BP Location: Left Arm, Patient Position: Sitting, Cuff Size: Normal)   Pulse 83   Temp (!) 97.1 F (36.2 C) (Temporal)   Resp 16   LMP  (LMP Unknown)   SpO2 96%  There is no height or weight on file to calculate BMI. Physical Exam Vitals and nursing note reviewed.  Constitutional:      Appearance: Normal appearance. She is well-developed.  HENT:     Head: Normocephalic and atraumatic.     Right Ear: Tympanic membrane and external ear normal.     Left Ear: Tympanic membrane and external ear normal.     Nose: Nose normal.     Mouth/Throat:     Mouth: Mucous membranes are moist.     Pharynx: Oropharynx is clear.  Eyes:     Conjunctiva/sclera: Conjunctivae normal.  Cardiovascular:  Rate and Rhythm: Normal rate and regular rhythm.     Heart sounds: Normal heart sounds. No murmur heard.   No friction rub. No gallop.  Pulmonary:     Effort: Pulmonary effort is normal.     Breath sounds: Normal breath sounds. No wheezing, rhonchi or rales.  Musculoskeletal:     Cervical back: Neck supple.  Skin:    General: Skin is warm.     Findings: Rash present.     Comments: Erythematous face.  Neurological:     Mental Status: She is alert and oriented to person, place, and time.  Psychiatric:        Behavior: Behavior normal.   Previous notes and tests were reviewed. The plan was reviewed with the patient/family, and all questions/concerned were addressed.  It was my pleasure to see Maranatha today and participate in her care. Please feel free to contact me with any questions or concerns.  Sincerely,  Wyline Mood, DO Allergy & Immunology  [facility] office: [phone] [facility] office: [phone]

## 2020-10-04 NOTE — Assessment & Plan Note (Signed)
Past history - Patient was seen by an integrative medicine physician in the past who ordered multiple food IgG blood work and performed skin prick testing which showed multiple positives - see scanned report.  She has been eating some of these foods with no issues.  She is avoiding bananas as it causes blisters in the mouth, beets due to mouth swelling, almonds due to rash and gluten causes fogginess/fatigue.  Limited dairy and soy ingestion due to positive testing in the past.  Continue to avoid foods that give you clinical reactions - bananas, beets, almonds, gluten.

## 2020-10-04 NOTE — Assessment & Plan Note (Addendum)
Past history - Slightly pruritic rash on the face which started in November 2021.  Tried oral prednisone and topical steroid cream with some benefit. She did have changes in her personal care products at that time. Interim history - TRUE and metal patch testing showed positive to gold and borderline to carba mix. Patient is using vanicream products and topical cream given by dermatology.  Continue proper skin care and only use items on the safe list.   Not sure what's causing the rash.  Follow up with dermatology - continue with the creams and antibiotics as prescribed.  . Get bloodwork to rule out other etiologies.

## 2020-10-04 NOTE — Assessment & Plan Note (Signed)
   TRUE and metal patches: Positive to gold and borderline positive to carba mix.   Continue avoidance.

## 2020-10-04 NOTE — Telephone Encounter (Signed)
Please call patient.  I forgot to ask if she got the safe list pdf emailed to her at the last patch testing visit. If yes, make sure she checks that the topical creams/ointments she is using are on the safe list to be used.  Only use items on the safe list.  Thank you.

## 2020-10-04 NOTE — Patient Instructions (Addendum)
Rash: Continue proper skin care.  Not sure what's causing the rash. Follow up with dermatology - continue with the creams and antibiotics as prescribed.  Get bloodwork: We are ordering labs, so please allow 1-2 weeks for the results to come back. With the newly implemented Cures Act, the labs might be visible to you at the same time that they become visible to me. However, I will not address the results until all of the results are back, so please be patient.   Environmental allergies 2022 skin testing showed: Positive to grass, trees and horse.  Continue environmental control measures as below. Use over the counter antihistamines such as Zyrtec (cetirizine), Claritin (loratadine), Allegra (fexofenadine), or Xyzal (levocetirizine) daily as needed. May take twice a day during allergy flares. May switch antihistamines every few months.  Food Continue to avoid foods that give you clinical reactions - bananas, beets, almonds, gluten. I don't think your rash is caused by foods though.   Follow up as needed.   Skin care recommendations  Bath time: Always use lukewarm water. AVOID very hot or cold water. Keep bathing time to 5-10 minutes. Do NOT use bubble bath. Use a mild soap and use just enough to wash the dirty areas. Do NOT scrub skin vigorously.  After bathing, pat dry your skin with a towel. Do NOT rub or scrub the skin.  Moisturizers and prescriptions:  ALWAYS apply moisturizers immediately after bathing (within 3 minutes). This helps to lock-in moisture. Use the moisturizer several times a day over the whole body. Good summer moisturizers include: Aveeno, CeraVe, Cetaphil. Good winter moisturizers include: Aquaphor, Vaseline, Cerave, Cetaphil, Eucerin, Vanicream. When using moisturizers along with medications, the moisturizer should be applied about one hour after applying the medication to prevent diluting effect of the medication or moisturize around where you applied the  medications. When not using medications, the moisturizer can be continued twice daily as maintenance.  Laundry and clothing: Avoid laundry products with added color or perfumes. Use unscented hypo-allergenic laundry products such as Tide free, Cheer free & gentle, and All free and clear.  If the skin still seems dry or sensitive, you can try double-rinsing the clothes. Avoid tight or scratchy clothing such as wool. Do not use fabric softeners or dyer sheets.  Reducing Pollen Exposure Pollen seasons: trees (spring), grass (summer) and ragweed/weeds (fall). Keep windows closed in your home and car to lower pollen exposure.  Install air conditioning in the bedroom and throughout the house if possible.  Avoid going out in dry windy days - especially early morning. Pollen counts are highest between 5 - 10 AM and on dry, hot and windy days.  Save outside activities for late afternoon or after a heavy rain, when pollen levels are lower.  Avoid mowing of grass if you have grass pollen allergy. Be aware that pollen can also be transported indoors on people and pets.  Dry your clothes in an automatic dryer rather than hanging them outside where they might collect pollen.  Rinse hair and eyes before bedtime.  Pet Allergen Avoidance: Contrary to popular opinion, there are no "hypoallergenic" breeds of dogs or cats. That is because people are not allergic to an animal's hair, but to an allergen found in the animal's saliva, dander (dead skin flakes) or urine. Pet allergy symptoms typically occur within minutes. For some people, symptoms can build up and become most severe 8 to 12 hours after contact with the animal. People with severe allergies can experience reactions in public  places if dander has been transported on the pet owners' clothing. Keeping an animal outdoors is only a partial solution, since homes with pets in the yard still have higher concentrations of animal allergens. Before getting a  pet, ask your allergist to determine if you are allergic to animals. If your pet is already considered part of your family, try to minimize contact and keep the pet out of the bedroom and other rooms where you spend a great deal of time. As with dust mites, vacuum carpets often or replace carpet with a hardwood floor, tile or linoleum. High-efficiency particulate air (HEPA) cleaners can reduce allergen levels over time. While dander and saliva are the source of cat and dog allergens, urine is the source of allergens from rabbits, hamsters, mice and [place] pigs; so ask a non-allergic family member to clean the animal's cage. If you have a pet allergy, talk to your allergist about the potential for allergy immunotherapy (allergy shots). This strategy can often provide long-term relief.

## 2020-10-04 NOTE — Telephone Encounter (Signed)
Pt has received list and informed of Dr. Reuel Derby message.

## 2020-10-04 NOTE — Assessment & Plan Note (Signed)
Past history - Rhinoconjunctivitis symptoms in the spring.  2022 skin testing showed: Positive to grass, trees and horse.  Interim history - loratadine helps with the sneezing.  Continue environmental control measures as below.  Use over the counter antihistamines such as Zyrtec (cetirizine), Claritin (loratadine), Allegra (fexofenadine), or Xyzal (levocetirizine) daily as needed. May take twice a day during allergy flares. May switch antihistamines every few months.

## 2020-11-02 ENCOUNTER — Encounter: Payer: Self-pay | Admitting: *Deleted
